# Patient Record
Sex: Female | Born: 1950 | Race: Black or African American | Hispanic: No | Marital: Married | Smoking: Never smoker
Health system: Southern US, Community
[De-identification: ages and names within clinical notes are randomized; demographics above are authoritative.]

## PROBLEM LIST (undated history)

## (undated) DIAGNOSIS — I10 Essential (primary) hypertension: Secondary | ICD-10-CM

## (undated) DIAGNOSIS — S83249A Other tear of medial meniscus, current injury, unspecified knee, initial encounter: Secondary | ICD-10-CM

## (undated) HISTORY — PX: BREAST SURGERY: SHX581

## (undated) HISTORY — PX: TONSILLECTOMY: SUR1361

## (undated) HISTORY — PX: CHOLECYSTECTOMY: SHX55

---

## 2006-07-03 ENCOUNTER — Encounter: Admission: RE | Admit: 2006-07-03 | Discharge: 2006-07-03 | Payer: Self-pay | Admitting: Family Medicine

## 2007-03-26 ENCOUNTER — Encounter: Admission: RE | Admit: 2007-03-26 | Discharge: 2007-03-26 | Payer: Self-pay | Admitting: Family Medicine

## 2007-04-03 ENCOUNTER — Encounter: Admission: RE | Admit: 2007-04-03 | Discharge: 2007-04-03 | Payer: Self-pay | Admitting: Family Medicine

## 2008-05-17 ENCOUNTER — Encounter: Admission: RE | Admit: 2008-05-17 | Discharge: 2008-05-17 | Payer: Self-pay

## 2010-07-26 ENCOUNTER — Encounter: Admission: RE | Admit: 2010-07-26 | Discharge: 2010-07-26 | Payer: Self-pay

## 2013-09-21 ENCOUNTER — Other Ambulatory Visit: Payer: Self-pay

## 2013-09-21 DIAGNOSIS — Z1231 Encounter for screening mammogram for malignant neoplasm of breast: Secondary | ICD-10-CM

## 2013-10-12 ENCOUNTER — Ambulatory Visit: Payer: Self-pay | Admitting: Cardiology

## 2013-10-14 ENCOUNTER — Ambulatory Visit
Admission: RE | Admit: 2013-10-14 | Discharge: 2013-10-14 | Disposition: A | Discharge status: Home / Self Care | Payer: PRIVATE HEALTH INSURANCE | Source: Ambulatory Visit

## 2013-10-14 DIAGNOSIS — Z1231 Encounter for screening mammogram for malignant neoplasm of breast: Secondary | ICD-10-CM

## 2015-06-20 ENCOUNTER — Other Ambulatory Visit: Payer: Self-pay

## 2015-06-20 DIAGNOSIS — Z1231 Encounter for screening mammogram for malignant neoplasm of breast: Secondary | ICD-10-CM

## 2015-06-26 ENCOUNTER — Ambulatory Visit
Admission: RE | Admit: 2015-06-26 | Discharge: 2015-06-26 | Disposition: A | Discharge status: Home / Self Care | Payer: PRIVATE HEALTH INSURANCE | Source: Ambulatory Visit

## 2015-06-26 DIAGNOSIS — Z1231 Encounter for screening mammogram for malignant neoplasm of breast: Secondary | ICD-10-CM

## 2017-08-05 ENCOUNTER — Other Ambulatory Visit: Payer: Self-pay | Admitting: Family Medicine

## 2017-08-05 DIAGNOSIS — Z1231 Encounter for screening mammogram for malignant neoplasm of breast: Secondary | ICD-10-CM

## 2017-08-14 ENCOUNTER — Ambulatory Visit
Admission: RE | Admit: 2017-08-14 | Discharge: 2017-08-14 | Disposition: A | Discharge status: Home / Self Care | Payer: Medicare Other | Source: Ambulatory Visit | Attending: Family Medicine | Admitting: Family Medicine

## 2017-08-14 DIAGNOSIS — Z1231 Encounter for screening mammogram for malignant neoplasm of breast: Secondary | ICD-10-CM

## 2020-03-21 ENCOUNTER — Other Ambulatory Visit: Payer: Self-pay

## 2020-03-21 ENCOUNTER — Ambulatory Visit (INDEPENDENT_AMBULATORY_CARE_PROVIDER_SITE_OTHER): Payer: Medicare Other | Admitting: Orthopedic Surgery

## 2020-03-21 ENCOUNTER — Ambulatory Visit (INDEPENDENT_AMBULATORY_CARE_PROVIDER_SITE_OTHER): Payer: Medicare Other

## 2020-03-21 DIAGNOSIS — G8929 Other chronic pain: Secondary | ICD-10-CM

## 2020-03-21 DIAGNOSIS — M25562 Pain in left knee: Secondary | ICD-10-CM

## 2020-03-21 DIAGNOSIS — M1712 Unilateral primary osteoarthritis, left knee: Secondary | ICD-10-CM

## 2020-03-21 DIAGNOSIS — M25462 Effusion, left knee: Secondary | ICD-10-CM

## 2020-03-22 ENCOUNTER — Encounter: Payer: Self-pay | Admitting: Orthopedic Surgery

## 2020-03-22 DIAGNOSIS — M1712 Unilateral primary osteoarthritis, left knee: Secondary | ICD-10-CM | POA: Diagnosis not present

## 2020-03-22 DIAGNOSIS — M25562 Pain in left knee: Secondary | ICD-10-CM

## 2020-03-22 DIAGNOSIS — G8929 Other chronic pain: Secondary | ICD-10-CM | POA: Diagnosis not present

## 2020-03-22 DIAGNOSIS — M25462 Effusion, left knee: Secondary | ICD-10-CM | POA: Diagnosis not present

## 2020-03-22 MED ORDER — LIDOCAINE HCL (PF) 1 % IJ SOLN
5.0000 mL | INTRAMUSCULAR | Status: AC | PRN
  Administered 2020-03-22: 17:00:00 5 mL

## 2020-03-22 MED ORDER — METHYLPREDNISOLONE ACETATE 40 MG/ML IJ SUSP
40.0000 mg | INTRAMUSCULAR | Status: AC | PRN
  Administered 2020-03-22: 40 mg via INTRA_ARTICULAR

## 2020-03-22 NOTE — Progress Notes (Signed)
   Office Visit Note   Patient: Nicole Knapp           Date of Birth: 06-23-1951           MRN: [id number] Visit Date: 03/21/2020              Requested by: Gaspar Skeeters, MD [address] [place],  [place] [address] PCP: Gaspar Skeeters, MD  Chief Complaint  Patient presents with  . Left Knee - Pain      HPI: Patient is a 69 year old woman who is having progressive increasing pain in the left knee she states that it is gotten worse lately but been present for several years she complains of swelling stiffness decreased range of motion.  She has tried Aleve and extra strength Tylenol without relief.  Assessment & Plan: Visit Diagnoses:  1. Chronic pain of left knee   2. Unilateral primary osteoarthritis, left knee     Plan: Patient's knee was aspirated 40 cc of clear synovial fluid and injected with steroid.  Reevaluate in 4 weeks.  Follow-Up Instructions: Return in about 4 weeks (around 04/18/2020).   Ortho Exam  Patient is alert, oriented, no adenopathy, well-dressed, normal affect, normal respiratory effort. Examination patient has an antalgic gait varus alignment to the left knee she does have a tense effusion.  She has crepitation with range of motion pain to palpation of the patellofemoral joint as well as medial and lateral joint line.  Imaging: No results found. No images are attached to the encounter.  Labs: No results found for: HGBA1C, ESRSEDRATE, CRP, LABURIC, REPTSTATUS, GRAMSTAIN, CULT, LABORGA   No results found for: ALBUMIN, PREALBUMIN, LABURIC  No results found for: MG No results found for: VD25OH  No results found for: PREALBUMIN No flowsheet data found.   There is no height or weight on file to calculate BMI.  Orders:  Orders Placed This Encounter  Procedures  . XR Knee 1-2 Views Left   No orders of the defined types were placed in this encounter.    Procedures: Large Joint Inj: L knee on 03/22/2020 4:52 PM Indications: pain  and diagnostic evaluation Details: 22 G 1.5 in needle, superolateral approach  Arthrogram: No  Medications: 5 mL lidocaine (PF) 1 %; 40 mg methylPREDNISolone acetate 40 MG/ML Aspirate: 40 mL clear Outcome: tolerated well, no immediate complications Procedure, treatment alternatives, risks and benefits explained, specific risks discussed. Consent was given by the patient. Immediately prior to procedure a time out was called to verify the correct patient, procedure, equipment, support staff and site/side marked as required. Patient was prepped and draped in the usual sterile fashion.      Clinical Data: No additional findings.  ROS:  All other systems negative, except as noted in the HPI. Review of Systems  Objective: Vital Signs: There were no vitals taken for this visit.  Specialty Comments:  No specialty comments available.  PMFS History: There are no problems to display for this patient.  History reviewed. No pertinent past medical history.  Family History  Problem Relation Age of Onset  . Breast cancer Neg Hx     History reviewed. No pertinent surgical history. Social History   Occupational History  . Not on file  Tobacco Use  . Smoking status: Not on file  Substance and Sexual Activity  . Alcohol use: Not on file  . Drug use: Not on file  . Sexual activity: Not on file

## 2020-04-20 ENCOUNTER — Other Ambulatory Visit: Payer: Self-pay

## 2020-04-20 ENCOUNTER — Ambulatory Visit (INDEPENDENT_AMBULATORY_CARE_PROVIDER_SITE_OTHER): Payer: Medicare Other | Admitting: Orthopedic Surgery

## 2020-04-20 ENCOUNTER — Encounter: Payer: Self-pay | Admitting: Orthopedic Surgery

## 2020-04-20 DIAGNOSIS — M23222 Derangement of posterior horn of medial meniscus due to old tear or injury, left knee: Secondary | ICD-10-CM | POA: Diagnosis not present

## 2020-04-20 DIAGNOSIS — M1712 Unilateral primary osteoarthritis, left knee: Secondary | ICD-10-CM | POA: Diagnosis not present

## 2020-04-20 NOTE — Progress Notes (Signed)
   Office Visit Note   Patient: Nicole Knapp           Date of Birth: [birth year]           MRN: [id number] Visit Date: 04/20/2020              Requested by: Gaspar Skeeters, MD [address] [place],  [place] [address] PCP: Gaspar Skeeters, MD  Chief Complaint  Patient presents with  . Left Knee - Follow-up    S/p aspiration and injection left knee 03/21/20      HPI: Patient is a 69 year old woman who is seen in follow-up for osteoarthritis medial meniscal pathology left knee.  Patient states the injection only provided temporary relief she states she has pain going up and down stairs and has recurrent effusion.  Assessment & Plan: Visit Diagnoses:  1. Unilateral primary osteoarthritis, left knee   2. Derangement of posterior horn of medial meniscus due to old tear or injury, left knee     Plan: Discussed with the patient we could proceed with an MRI scan for consideration for arthroscopic debridement.  Discussed that arthroscopy for the arthritis and meniscal pathology has a 50% success rate.  Also discussed total knee arthroplasty and the need for aggressive physical therapy postoperatively.  We will follow-up after the MRI scan and discuss options at that time.  Follow-Up Instructions: Return in about 1 week (around 04/27/2020).   Ortho Exam  Patient is alert, oriented, no adenopathy, well-dressed, normal affect, normal respiratory effort. Examination patient has a mild effusion there is no redness no septal cellulitis.  She has point tender to palpation of the medial joint line with mechanical crepitation with range of motion.  Imaging: No results found. No images are attached to the encounter.  Labs: No results found for: HGBA1C, ESRSEDRATE, CRP, LABURIC, REPTSTATUS, GRAMSTAIN, CULT, LABORGA   No results found for: ALBUMIN, PREALBUMIN, LABURIC  No results found for: MG No results found for: VD25OH  No results found for: PREALBUMIN No flowsheet data  found.   There is no height or weight on file to calculate BMI.  Orders:  Orders Placed This Encounter  Procedures  . MR Knee Left w/o contrast   No orders of the defined types were placed in this encounter.    Procedures: No procedures performed  Clinical Data: No additional findings.  ROS:  All other systems negative, except as noted in the HPI. Review of Systems  Objective: Vital Signs: There were no vitals taken for this visit.  Specialty Comments:  No specialty comments available.  PMFS History: There are no problems to display for this patient.  History reviewed. No pertinent past medical history.  Family History  Problem Relation Age of Onset  . Breast cancer Neg Hx     History reviewed. No pertinent surgical history. Social History   Occupational History  . Not on file  Tobacco Use  . Smoking status: Not on file  Substance and Sexual Activity  . Alcohol use: Not on file  . Drug use: Not on file  . Sexual activity: Not on file

## 2020-05-18 ENCOUNTER — Ambulatory Visit
Admission: RE | Admit: 2020-05-18 | Discharge: 2020-05-18 | Disposition: A | Discharge status: Home / Self Care | Payer: Medicare Other | Source: Ambulatory Visit | Attending: Orthopedic Surgery | Admitting: Orthopedic Surgery

## 2020-05-18 ENCOUNTER — Other Ambulatory Visit: Payer: Self-pay

## 2020-05-18 DIAGNOSIS — M23222 Derangement of posterior horn of medial meniscus due to old tear or injury, left knee: Secondary | ICD-10-CM

## 2020-05-18 DIAGNOSIS — M1712 Unilateral primary osteoarthritis, left knee: Secondary | ICD-10-CM

## 2020-05-22 ENCOUNTER — Encounter: Payer: Self-pay | Admitting: Orthopedic Surgery

## 2020-05-22 ENCOUNTER — Other Ambulatory Visit: Payer: Self-pay

## 2020-05-22 ENCOUNTER — Ambulatory Visit (INDEPENDENT_AMBULATORY_CARE_PROVIDER_SITE_OTHER): Payer: Medicare Other | Admitting: Orthopedic Surgery

## 2020-05-22 VITALS — Ht 66.0 in | Wt 180.0 lb

## 2020-05-22 DIAGNOSIS — M23222 Derangement of posterior horn of medial meniscus due to old tear or injury, left knee: Secondary | ICD-10-CM

## 2020-05-22 DIAGNOSIS — M1712 Unilateral primary osteoarthritis, left knee: Secondary | ICD-10-CM | POA: Diagnosis not present

## 2020-05-22 NOTE — Progress Notes (Signed)
   Office Visit Note   Patient: Nicole Knapp           Date of Birth: 1951/01/05           MRN: [id number] Visit Date: 05/22/2020              Requested by: Gaspar Skeeters, MD [address] [place],  [place] [address] PCP: Gaspar Skeeters, MD  Chief Complaint  Patient presents with  . Left Knee - Follow-up, Pain    MRI Review Left Knee      HPI: Patient is a 69 year old woman with persistent medial joint line pain left knee patient complains of persistent swelling as well.  She is status post an MRI scan.  Assessment & Plan: Visit Diagnoses:  1. Unilateral primary osteoarthritis, left knee   2. Derangement of posterior horn of medial meniscus due to old tear or injury, left knee     Plan: Discussed that with the meniscal tear we could proceed with arthroscopic debridement of the meniscal tear as well as debridement of the arthritis.  Discussed that we should build to resolve the pain from the meniscus tear but most likely will not resolve the pain from the arthritis discussed that her improvement may be about 50%.  Patient states she understands wished to proceed with outpatient arthroscopic surgery.  Follow-Up Instructions: Return in about 2 weeks (around 06/05/2020).   Ortho Exam  Patient is alert, oriented, no adenopathy, well-dressed, normal affect, normal respiratory effort. Examination patient has no effusion she has full range of motion of her knee collaterals and cruciates are stable she is still tender to palpation of medial joint line.  Review of the MRI scan shows a complex tear of the posterior medial meniscus as well as tricompartmental arthritic changes.  Imaging: No results found. No images are attached to the encounter.  Labs: No results found for: HGBA1C, ESRSEDRATE, CRP, LABURIC, REPTSTATUS, GRAMSTAIN, CULT, LABORGA   No results found for: ALBUMIN, PREALBUMIN, LABURIC  No results found for: MG No results found for: VD25OH  No results found  for: PREALBUMIN No flowsheet data found.   Body mass index is 29.05 kg/m.  Orders:  No orders of the defined types were placed in this encounter.  No orders of the defined types were placed in this encounter.    Procedures: No procedures performed  Clinical Data: No additional findings.  ROS:  All other systems negative, except as noted in the HPI. Review of Systems  Objective: Vital Signs: Ht 5\' 6"  (1.676 m)   Wt 180 lb (81.6 kg)   BMI 29.05 kg/m   Specialty Comments:  No specialty comments available.  PMFS History: There are no problems to display for this patient.  History reviewed. No pertinent past medical history.  Family History  Problem Relation Age of Onset  . Breast cancer Neg Hx     History reviewed. No pertinent surgical history. Social History   Occupational History  . Not on file  Tobacco Use  . Smoking status: Never Smoker  . Smokeless tobacco: Never Used  Substance and Sexual Activity  . Alcohol use: Never  . Drug use: Never  . Sexual activity: Not Currently

## 2020-05-28 ENCOUNTER — Other Ambulatory Visit: Payer: Self-pay | Admitting: Physician Assistant

## 2020-06-06 ENCOUNTER — Other Ambulatory Visit: Payer: Self-pay

## 2020-06-06 ENCOUNTER — Encounter (HOSPITAL_BASED_OUTPATIENT_CLINIC_OR_DEPARTMENT_OTHER): Payer: Self-pay | Admitting: Orthopedic Surgery

## 2020-06-09 ENCOUNTER — Encounter (HOSPITAL_BASED_OUTPATIENT_CLINIC_OR_DEPARTMENT_OTHER)
Admission: RE | Admit: 2020-06-09 | Discharge: 2020-06-09 | Disposition: A | Discharge status: Home / Self Care | Payer: Medicare Other | Source: Ambulatory Visit | Attending: Orthopedic Surgery | Admitting: Orthopedic Surgery

## 2020-06-09 ENCOUNTER — Other Ambulatory Visit (HOSPITAL_COMMUNITY)
Admission: RE | Admit: 2020-06-09 | Discharge: 2020-06-09 | Disposition: A | Discharge status: Home / Self Care | Payer: Medicare Other | Source: Ambulatory Visit | Attending: Orthopedic Surgery | Admitting: Orthopedic Surgery

## 2020-06-09 DIAGNOSIS — Z20822 Contact with and (suspected) exposure to covid-19: Secondary | ICD-10-CM | POA: Insufficient documentation

## 2020-06-09 DIAGNOSIS — S83242A Other tear of medial meniscus, current injury, left knee, initial encounter: Secondary | ICD-10-CM | POA: Diagnosis present

## 2020-06-09 DIAGNOSIS — M1712 Unilateral primary osteoarthritis, left knee: Secondary | ICD-10-CM | POA: Diagnosis not present

## 2020-06-09 DIAGNOSIS — Z01812 Encounter for preprocedural laboratory examination: Secondary | ICD-10-CM | POA: Diagnosis present

## 2020-06-09 DIAGNOSIS — Z79899 Other long term (current) drug therapy: Secondary | ICD-10-CM | POA: Diagnosis not present

## 2020-06-09 DIAGNOSIS — X58XXXA Exposure to other specified factors, initial encounter: Secondary | ICD-10-CM | POA: Diagnosis not present

## 2020-06-09 DIAGNOSIS — I10 Essential (primary) hypertension: Secondary | ICD-10-CM | POA: Diagnosis not present

## 2020-06-09 LAB — BASIC METABOLIC PANEL
Anion gap: 10 (ref 5–15)
BUN: 8 mg/dL (ref 8–23)
CO2: 26 mmol/L (ref 22–32)
Calcium: 8.9 mg/dL (ref 8.9–10.3)
Chloride: 103 mmol/L (ref 98–111)
Creatinine, Ser: 0.55 mg/dL (ref 0.44–1.00)
GFR calc Af Amer: >60 mL/min (ref >60)
GFR calc non Af Amer: >60 mL/min (ref >60)
Glucose, Bld: 107 mg/dL — ABNORMAL HIGH (ref 70–99)
Potassium: 3.9 mmol/L (ref 3.5–5.1)
Sodium: 139 mmol/L (ref 135–145)

## 2020-06-09 LAB — SARS CORONAVIRUS 2 (TAT 6-24 HRS): SARS Coronavirus 2: NEGATIVE

## 2020-06-09 NOTE — Progress Notes (Signed)

## 2020-06-13 ENCOUNTER — Ambulatory Visit (HOSPITAL_BASED_OUTPATIENT_CLINIC_OR_DEPARTMENT_OTHER)
Admission: RE | Admit: 2020-06-13 | Discharge: 2020-06-13 | Disposition: A | Discharge status: Home / Self Care | Payer: Medicare Other | Attending: Orthopedic Surgery | Admitting: Orthopedic Surgery

## 2020-06-13 ENCOUNTER — Encounter (HOSPITAL_BASED_OUTPATIENT_CLINIC_OR_DEPARTMENT_OTHER): Payer: Self-pay | Admitting: Orthopedic Surgery

## 2020-06-13 ENCOUNTER — Ambulatory Visit (HOSPITAL_BASED_OUTPATIENT_CLINIC_OR_DEPARTMENT_OTHER): Payer: Medicare Other | Admitting: Anesthesiology

## 2020-06-13 ENCOUNTER — Encounter (HOSPITAL_BASED_OUTPATIENT_CLINIC_OR_DEPARTMENT_OTHER)
Admission: RE | Disposition: A | Discharge status: Home / Self Care | Payer: Self-pay | Source: Home / Self Care | Attending: Orthopedic Surgery

## 2020-06-13 ENCOUNTER — Other Ambulatory Visit: Payer: Self-pay

## 2020-06-13 DIAGNOSIS — M1712 Unilateral primary osteoarthritis, left knee: Secondary | ICD-10-CM | POA: Insufficient documentation

## 2020-06-13 DIAGNOSIS — Z79899 Other long term (current) drug therapy: Secondary | ICD-10-CM | POA: Diagnosis not present

## 2020-06-13 DIAGNOSIS — S83242A Other tear of medial meniscus, current injury, left knee, initial encounter: Secondary | ICD-10-CM | POA: Diagnosis not present

## 2020-06-13 DIAGNOSIS — I10 Essential (primary) hypertension: Secondary | ICD-10-CM | POA: Insufficient documentation

## 2020-06-13 DIAGNOSIS — X58XXXA Exposure to other specified factors, initial encounter: Secondary | ICD-10-CM | POA: Insufficient documentation

## 2020-06-13 DIAGNOSIS — M23322 Other meniscus derangements, posterior horn of medial meniscus, left knee: Secondary | ICD-10-CM | POA: Diagnosis not present

## 2020-06-13 HISTORY — DX: Essential (primary) hypertension: I10

## 2020-06-13 HISTORY — PX: KNEE ARTHROSCOPY WITH MEDIAL MENISECTOMY: SHX5651

## 2020-06-13 HISTORY — DX: Other tear of medial meniscus, current injury, unspecified knee, initial encounter: S83.249A

## 2020-06-13 SURGERY — ARTHROSCOPY, KNEE, WITH MEDIAL MENISCECTOMY
Anesthesia: General | Site: Knee | Laterality: Left

## 2020-06-13 MED ORDER — OXYCODONE-ACETAMINOPHEN 5-325 MG PO TABS
1.0000 | ORAL_TABLET | ORAL | 0 refills | Status: AC | PRN
Start: 2020-06-13 — End: 2021-06-13

## 2020-06-13 MED ORDER — FENTANYL CITRATE (PF) 100 MCG/2ML IJ SOLN
INTRAMUSCULAR | Status: DC | PRN
  Administered 2020-06-13 (×5): 25 ug via INTRAVENOUS

## 2020-06-13 MED ORDER — MIDAZOLAM HCL 2 MG/2ML IJ SOLN
INTRAMUSCULAR | Status: AC
  Filled 2020-06-13: qty 2

## 2020-06-13 MED ORDER — ONDANSETRON HCL 4 MG/2ML IJ SOLN
INTRAMUSCULAR | Status: DC | PRN
  Administered 2020-06-13: 4 mg via INTRAVENOUS

## 2020-06-13 MED ORDER — FENTANYL CITRATE (PF) 100 MCG/2ML IJ SOLN
INTRAMUSCULAR | Status: AC
  Filled 2020-06-13: qty 2

## 2020-06-13 MED ORDER — OXYCODONE HCL 5 MG PO TABS: 5.0000 mg | ORAL_TABLET | Freq: Once | ORAL | Status: DC | PRN

## 2020-06-13 MED ORDER — CEFAZOLIN SODIUM-DEXTROSE 2-4 GM/100ML-% IV SOLN
INTRAVENOUS | Status: AC
  Filled 2020-06-13: qty 100

## 2020-06-13 MED ORDER — PROPOFOL 10 MG/ML IV BOLUS
INTRAVENOUS | Status: DC | PRN
  Administered 2020-06-13: 200 mg via INTRAVENOUS

## 2020-06-13 MED ORDER — OXYCODONE HCL 5 MG/5ML PO SOLN: 5.0000 mg | Freq: Once | ORAL | Status: DC | PRN

## 2020-06-13 MED ORDER — AMISULPRIDE (ANTIEMETIC) 5 MG/2ML IV SOLN: 10.0000 mg | Freq: Once | INTRAVENOUS | Status: DC | PRN

## 2020-06-13 MED ORDER — CEFAZOLIN SODIUM-DEXTROSE 2-4 GM/100ML-% IV SOLN
2.0000 g | INTRAVENOUS | Status: AC
  Administered 2020-06-13: 2 g via INTRAVENOUS

## 2020-06-13 MED ORDER — PROPOFOL 10 MG/ML IV BOLUS
INTRAVENOUS | Status: AC
  Filled 2020-06-13: qty 20

## 2020-06-13 MED ORDER — HYDROMORPHONE HCL 1 MG/ML IJ SOLN: 0.2500 mg | INTRAMUSCULAR | Status: DC | PRN

## 2020-06-13 MED ORDER — PROMETHAZINE HCL 25 MG/ML IJ SOLN: 6.2500 mg | INTRAMUSCULAR | Status: DC | PRN

## 2020-06-13 MED ORDER — LIDOCAINE HCL (CARDIAC) PF 100 MG/5ML IV SOSY
PREFILLED_SYRINGE | INTRAVENOUS | Status: DC | PRN
  Administered 2020-06-13: 60 mg via INTRAVENOUS

## 2020-06-13 MED ORDER — SODIUM CHLORIDE 0.9 % IR SOLN
Status: DC | PRN
  Administered 2020-06-13: 6000 mL

## 2020-06-13 MED ORDER — LACTATED RINGERS IV SOLN: INTRAVENOUS | Status: DC

## 2020-06-13 MED ORDER — DEXAMETHASONE SODIUM PHOSPHATE 10 MG/ML IJ SOLN
INTRAMUSCULAR | Status: DC | PRN
  Administered 2020-06-13: 4 mg via INTRAVENOUS

## 2020-06-13 SURGICAL SUPPLY — 36 items
BLADE EXCALIBUR 4.0MM X 13CM (MISCELLANEOUS) ×1
BLADE EXCALIBUR 4.0X13 (MISCELLANEOUS) ×1 IMPLANT
BNDG COHESIVE 6X5 TAN STRL LF (GAUZE/BANDAGES/DRESSINGS) ×5 IMPLANT
COVER WAND RF STERILE (DRAPES) IMPLANT
DISSECTOR 4.0MM X 13CM (MISCELLANEOUS) IMPLANT
DRAPE ARTHROSCOPY W/POUCH 90 (DRAPES) ×3 IMPLANT
DRAPE U-SHAPE 47X51 STRL (DRAPES) ×3 IMPLANT
DRSG EMULSION OIL 3X3 NADH (GAUZE/BANDAGES/DRESSINGS) ×3 IMPLANT
DURAPREP 26ML APPLICATOR (WOUND CARE) ×3 IMPLANT
EXCALIBUR 3.8MM X 13CM (MISCELLANEOUS) ×1 IMPLANT
GAUZE SPONGE 4X4 12PLY STRL (GAUZE/BANDAGES/DRESSINGS) ×3 IMPLANT
GLOVE BIO SURGEON STRL SZ 6.5 (GLOVE) ×2 IMPLANT
GLOVE BIO SURGEONS STRL SZ 6.5 (GLOVE) ×2
GLOVE BIOGEL PI IND STRL 7.0 (GLOVE) IMPLANT
GLOVE BIOGEL PI IND STRL 9 (GLOVE) ×1 IMPLANT
GLOVE BIOGEL PI INDICATOR 7.0 (GLOVE) ×2
GLOVE BIOGEL PI INDICATOR 9 (GLOVE) ×2
GLOVE SURG ORTHO 9.0 STRL STRW (GLOVE) ×3 IMPLANT
GOWN STRL REUS W/ TWL LRG LVL3 (GOWN DISPOSABLE) ×1 IMPLANT
GOWN STRL REUS W/ TWL XL LVL3 (GOWN DISPOSABLE) ×1 IMPLANT
GOWN STRL REUS W/TWL LRG LVL3 (GOWN DISPOSABLE) ×6
GOWN STRL REUS W/TWL XL LVL3 (GOWN DISPOSABLE) ×3
KNEE WRAP E Z 3 GEL PACK (MISCELLANEOUS) ×2 IMPLANT
MANIFOLD NEPTUNE II (INSTRUMENTS) ×2 IMPLANT
NDL SAFETY ECLIPSE 18X1.5 (NEEDLE) ×1 IMPLANT
NDL SPNL 18GX3.5 QUINCKE PK (NEEDLE) IMPLANT
NEEDLE HYPO 18GX1.5 SHARP (NEEDLE) ×3
NEEDLE SPNL 18GX3.5 QUINCKE PK (NEEDLE) ×3 IMPLANT
PACK DSU ARTHROSCOPY (CUSTOM PROCEDURE TRAY) ×3 IMPLANT
PORT APPOLLO RF 90DEGREE MULTI (SURGICAL WAND) IMPLANT
PROBE APOLLO 90XL (SURGICAL WAND) ×2 IMPLANT
SET BASIN DAY SURGERY F.S. (CUSTOM PROCEDURE TRAY) ×3 IMPLANT
SUT ETHILON 2 0 FSLX (SUTURE) ×3 IMPLANT
SYR 20ML LL LF (SYRINGE) ×2 IMPLANT
TOWEL GREEN STERILE FF (TOWEL DISPOSABLE) ×3 IMPLANT
TUBING ARTHROSCOPY IRRIG 16FT (MISCELLANEOUS) ×3 IMPLANT

## 2020-06-13 NOTE — Discharge Instructions (Signed)

## 2020-06-13 NOTE — Anesthesia Procedure Notes (Signed)
Procedure Name: LMA Insertion Date/Time: 06/13/2020 9:50 AM Performed by: Lauralyn Primes, CRNA Pre-anesthesia Checklist: Patient identified, Emergency Drugs available, Suction available and Patient being monitored Patient Re-evaluated:Patient Re-evaluated prior to induction Oxygen Delivery Method: Circle system utilized Preoxygenation: Pre-oxygenation with 100% oxygen Induction Type: IV induction Ventilation: Mask ventilation without difficulty LMA: LMA inserted LMA Size: 4.0 Number of attempts: 1 Airway Equipment and Method: Bite block Placement Confirmation: positive ETCO2 Tube secured with: Tape Dental Injury: Teeth and Oropharynx as per pre-operative assessment

## 2020-06-13 NOTE — Transfer of Care (Signed)
Immediate Anesthesia Transfer of Care Note  Patient: Nicole Knapp  Procedure(s) Performed: LEFT KNEE ARTHROSCOPIC DEBRIDEMENT WITH MEDIAL MENISECTOMY AND CHONDROPLASTY (Left Knee)  Patient Location: PACU  Anesthesia Type:General  Level of Consciousness: awake, alert  and oriented  Airway & Oxygen Therapy: Patient Spontanous Breathing and Patient connected to nasal cannula oxygen  Post-op Assessment: Report given to RN and Post -op Vital signs reviewed and stable  Post vital signs: Reviewed and stable  Last Vitals:  Vitals Value Taken Time  BP 140/101 06/13/20 1036  Temp    Pulse    Resp 15 06/13/20 1038  SpO2    Vitals shown include unvalidated device data.  Last Pain:  Vitals:   06/13/20 0813  TempSrc: Oral  PainSc: 4       Patients Stated Pain Goal: 4 (06/13/20 0813)  Complications: No complications documented.

## 2020-06-13 NOTE — H&P (Signed)
Nicole Knapp is an 69 y.o. female.   Chief Complaint: Left Knee Pain HPI: Patient is a 69 year old woman with persistent medial joint line pain left knee patient complains of persistent swelling as well.  She is status post an MRI scan.  Past Medical History:  Diagnosis Date  . Hypertension   . MMT (medial meniscus tear)    left knee    Past Surgical History:  Procedure Laterality Date  . BREAST SURGERY Left    breast biopsy  . CESAREAN SECTION     x2  . CHOLECYSTECTOMY    . TONSILLECTOMY      Family History  Problem Relation Age of Onset  . Breast cancer Neg Hx    Social History:  reports that she has never smoked. She has never used smokeless tobacco. She reports that she does not drink alcohol and does not use drugs.  Allergies: No Known Allergies  No medications prior to admission.    No results found for this or any previous visit (from the past 48 hour(s)). No results found.  Review of Systems  All other systems reviewed and are negative.   Height 5\' 6"  (1.676 m), weight 81.6 kg. Physical Exam  Patient is alert, oriented, no adenopathy, well-dressed, normal affect, normal respiratory effort. Examination patient has no effusion she has full range of motion of her knee collaterals and cruciates are stable she is still tender to palpation of medial joint line.  Review of the MRI scan shows a complex tear of the posterior medial meniscus as well as tricompartmental arthritic changes. Lungs Clear heart RRR  Assessment/Plan  1. Unilateral primary osteoarthritis, left knee   2. Derangement of posterior horn of medial meniscus due to old tear or injury, left knee     Plan: Discussed that with the meniscal tear we could proceed with arthroscopic debridement of the meniscal tear as well as debridement of the arthritis.  Discussed that we should build to resolve the pain from the meniscus tear but most likely will not resolve the pain from the arthritis discussed  that her improvement may be about 50%.  Patient states she understands wished to proceed with outpatient arthroscopic surgery.  Nicole Taddei, PA 06/13/2020, 6:38 AM

## 2020-06-13 NOTE — Op Note (Signed)
06/13/2020  10:33 AM  PATIENT:  Nicole Knapp    PRE-OPERATIVE DIAGNOSIS:  Medial Meniscal Tear Left Knee  POST-OPERATIVE DIAGNOSIS:  Same  PROCEDURE:  LEFT KNEE ARTHROSCOPIC DEBRIDEMENT WITH MEDIAL MENISECTOMY AND CHONDROPLASTY  SURGEON:  Nadara Mustard, MD  PHYSICIAN ASSISTANT:None ANESTHESIA:   General  PREOPERATIVE INDICATIONS:  Nicole Knapp is a  69 y.o. female with a diagnosis of Medial Meniscal Tear Left Knee who failed conservative measures and elected for surgical management.    The risks benefits and alternatives were discussed with the patient preoperatively including but not limited to the risks of infection, bleeding, nerve injury, cardiopulmonary complications, the need for revision surgery, among others, and the patient was willing to proceed.  OPERATIVE IMPLANTS: None  @ENCIMAGES @  OPERATIVE FINDINGS: Degenerative posterior horn of the medial meniscus with large osteochondral defect of the medial femoral condyle and medial tibial plateau with eburnated bone on the medial tibial plateau  OPERATIVE PROCEDURE: Patient was brought the operating room underwent a general anesthetic.  After adequate levels anesthesia were obtained patient's left lower extremity was prepped using DuraPrep draped into a sterile field a timeout was called.  The scope was inserted through the anterior lateral portal anterior medial working portal was established.  Visualization showed a significant medicine of Vitas this was resected with a shaver and electrocautery was used for hemostasis.  The ACL was intact.  With valgus stress patient had a large degenerative tear of the posterior horn of the medial meniscus this was debrided back to stable viable margin with the shaver.  There was extensive osteochondral defect of the medial tibial plateau with essentially eburnated bone.  The shaver was used to debride the osteochondral defect back to bleeding viable subchondral bone there was also  degenerative osteochondral changes of the medial femoral condyle and this was also debrided back to stable viable margins.  Examination of the lateral joint line in the figure-of-four position showed an intact joint line intact meniscus.  With the knee extended patient had a plica of this was excised there was extensive synovitis that extended down anteriorly over the lateral femoral condyle and this was debrided with a shaver and the electrical wand was used hemostasis.  Survey of all compartments showed there to be no loose bodies the implants removed the portals were closed using 2-0 nylon a sterile dressing was applied patient was extubated taken the PACU in stable condition   DISCHARGE PLANNING:  Antibiotic duration: Preoperative antibiotics  Weightbearing: Weightbearing as tolerated  Pain medication: Prescription for Percocet  Dressing care/ Wound VAC: Discontinue dressing in 2 days  Ambulatory devices: Crutches  Discharge to: Home  Follow-up: In the office 1 week post operative.

## 2020-06-13 NOTE — Anesthesia Preprocedure Evaluation (Signed)
Anesthesia Evaluation    Airway Mallampati: II  TM Distance: >3 FB Neck ROM: Full    Dental no notable dental hx.    Pulmonary    Pulmonary exam normal breath sounds clear to auscultation       Cardiovascular hypertension, Pt. on medications Normal cardiovascular exam Rhythm:Regular Rate:Normal     Neuro/Psych    GI/Hepatic   Endo/Other    Renal/GU      Musculoskeletal   Abdominal   Peds  Hematology   Anesthesia Other Findings   Reproductive/Obstetrics                             Anesthesia Physical Anesthesia Plan  ASA: II  Anesthesia Plan: General   Post-op Pain Management:    Induction: Intravenous  PONV Risk Score and Plan: 3 and Ondansetron, Dexamethasone, Midazolam and Treatment may vary due to age or medical condition  Airway Management Planned: LMA  Additional Equipment:   Intra-op Plan:   Post-operative Plan: Extubation in OR  Informed Consent: I have reviewed the patients History and Physical, chart, labs and discussed the procedure including the risks, benefits and alternatives for the proposed anesthesia with the patient or authorized representative who has indicated his/her understanding and acceptance.     Dental advisory given  Plan Discussed with: CRNA  Anesthesia Plan Comments:         Anesthesia Quick Evaluation

## 2020-06-13 NOTE — Anesthesia Postprocedure Evaluation (Signed)
Anesthesia Post Note  Patient: Nicole Knapp  Procedure(s) Performed: LEFT KNEE ARTHROSCOPIC DEBRIDEMENT WITH MEDIAL MENISECTOMY AND CHONDROPLASTY (Left Knee)     Patient location during evaluation: PACU Anesthesia Type: General Level of consciousness: awake and alert Pain management: pain level controlled Vital Signs Assessment: post-procedure vital signs reviewed and stable Respiratory status: spontaneous breathing, nonlabored ventilation and respiratory function stable Cardiovascular status: blood pressure returned to baseline and stable Postop Assessment: no apparent nausea or vomiting Anesthetic complications: no   No complications documented.  Last Vitals:  Vitals:   06/13/20 1100 06/13/20 1134  BP: 137/83 (!) 161/84  Pulse: 71 76  Resp: 16 18  Temp:  36.7 C  SpO2: 100% 95%    Last Pain:  Vitals:   06/13/20 1134  TempSrc: Oral  PainSc: 0-No pain                 Lowella Curb

## 2020-06-14 ENCOUNTER — Encounter (HOSPITAL_BASED_OUTPATIENT_CLINIC_OR_DEPARTMENT_OTHER): Payer: Self-pay | Admitting: Orthopedic Surgery

## 2020-06-26 ENCOUNTER — Ambulatory Visit (INDEPENDENT_AMBULATORY_CARE_PROVIDER_SITE_OTHER): Payer: Medicare Other | Admitting: Orthopedic Surgery

## 2020-06-26 ENCOUNTER — Other Ambulatory Visit: Payer: Self-pay

## 2020-06-26 ENCOUNTER — Encounter: Payer: Self-pay | Admitting: Orthopedic Surgery

## 2020-06-26 VITALS — Ht 66.0 in | Wt 179.2 lb

## 2020-06-26 DIAGNOSIS — M23222 Derangement of posterior horn of medial meniscus due to old tear or injury, left knee: Secondary | ICD-10-CM

## 2020-06-27 ENCOUNTER — Encounter: Payer: Self-pay | Admitting: Orthopedic Surgery

## 2020-06-27 NOTE — Progress Notes (Signed)
   Office Visit Note   Patient: Nicole Knapp           Date of Birth: November 23, 1951           MRN: [id number] Visit Date: 06/26/2020              Requested by: Gaspar Skeeters, MD [address] [place],  [place] [address] PCP: Gaspar Skeeters, MD  Chief Complaint  Patient presents with  . Left Knee - Routine Post Op    06/13/20 Left Knee Artho & Deb w/medial menisectomy & chondroplasty      HPI: Patient presents 2 weeks status post left knee arthroscopy with debridement medial meniscal tear.  Patient states she has some tenderness medially but otherwise feels like she is doing well.  Assessment & Plan: Visit Diagnoses:  1. Derangement of posterior horn of medial meniscus due to old tear or injury, left knee     Plan: Sutures are harvested she will work on quad VMO strengthening increase her activities as tolerated she was given a note that she may return to work on July 26  Follow-Up Instructions: Return in about 3 weeks (around 07/17/2020).   Ortho Exam  Patient is alert, oriented, no adenopathy, well-dressed, normal affect, normal respiratory effort. Examination patient has some mild swelling there is no cellulitis incisions well-healed sutures are harvested.  Patient does have VMO atrophy.  Imaging: No results found. No images are attached to the encounter.  Labs: No results found for: HGBA1C, ESRSEDRATE, CRP, LABURIC, REPTSTATUS, GRAMSTAIN, CULT, LABORGA   No results found for: ALBUMIN, PREALBUMIN, LABURIC  No results found for: MG No results found for: VD25OH  No results found for: PREALBUMIN No flowsheet data found.   Body mass index is 28.93 kg/m.  Orders:  No orders of the defined types were placed in this encounter.  No orders of the defined types were placed in this encounter.    Procedures: No procedures performed  Clinical Data: No additional findings.  ROS:  All other systems negative, except as noted in the HPI. Review of  Systems  Objective: Vital Signs: Ht 5\' 6"  (1.676 m)   Wt 179 lb 3.7 oz (81.3 kg)   BMI 28.93 kg/m   Specialty Comments:  No specialty comments available.  PMFS History: Patient Active Problem List   Diagnosis Date Noted  . Medial meniscus, posterior horn derangement, left    Past Medical History:  Diagnosis Date  . Hypertension   . MMT (medial meniscus tear)    left knee    Family History  Problem Relation Age of Onset  . Breast cancer Neg Hx     Past Surgical History:  Procedure Laterality Date  . BREAST SURGERY Left    breast biopsy  . CESAREAN SECTION     x2  . CHOLECYSTECTOMY    . KNEE ARTHROSCOPY WITH MEDIAL MENISECTOMY Left 06/13/2020   Procedure: LEFT KNEE ARTHROSCOPIC DEBRIDEMENT WITH MEDIAL MENISECTOMY AND CHONDROPLASTY;  Surgeon: 06/15/2020, MD;  Location: [facility];  Service: Orthopedics;  Laterality: Left;  . TONSILLECTOMY     Social History   Occupational History  . Not on file  Tobacco Use  . Smoking status: Never Smoker  . Smokeless tobacco: Never Used  Substance and Sexual Activity  . Alcohol use: Never  . Drug use: Never  . Sexual activity: Not Currently

## 2020-07-03 IMAGING — MR MR KNEE*L* W/O CM
4 of 7 series · 22 of 40 positions shown · non-contrast
Comparison: None.

CLINICAL DATA: Medial left knee pain. Generalized swelling and
weakness.

EXAM:
MRI OF THE LEFT KNEE WITHOUT CONTRAST
TECHNIQUE: Multiplanar, multisequence MR imaging of the knee was performed. No
intravenous contrast was administered.

[Series 3: T2 fat-sat · axial · 4.0mm · 0.50mm/px · z∈[-48,+67]mm · 6 of 24 slices shown]
[im 1/24]
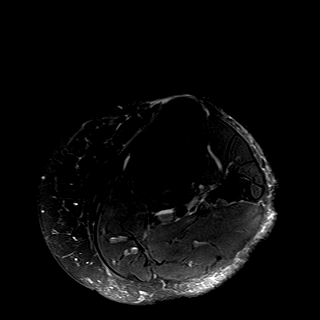
[im 5/24]
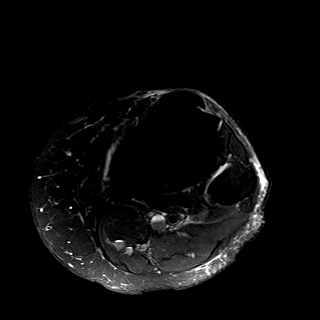
[im 10/24]
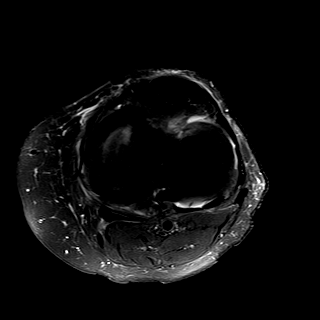
[im 14/24]
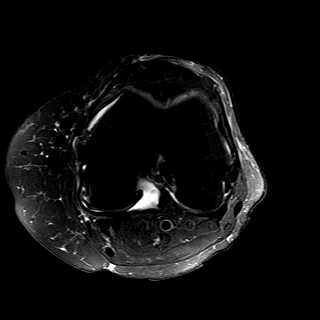
[im 19/24]
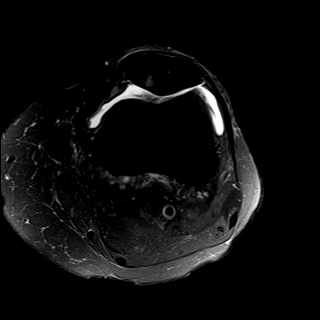
[im 24/24]
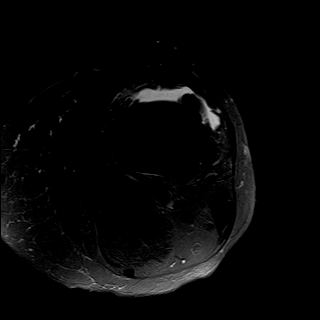

[Series 7: PD fat-sat · sagittal · 3.0mm · 0.29mm/px · 6 of 28 slices shown (1 of 3)]
[im 1/28]
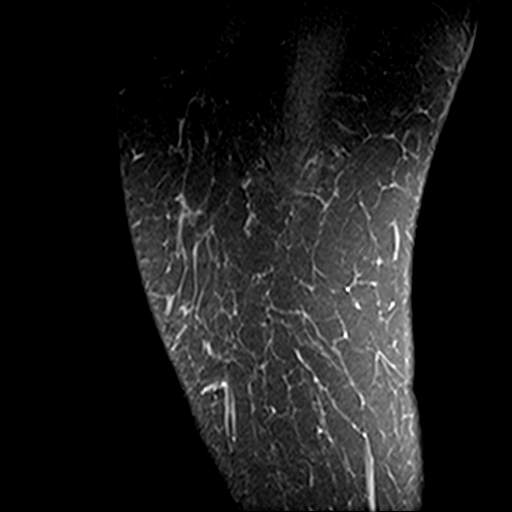
[im 6/28]
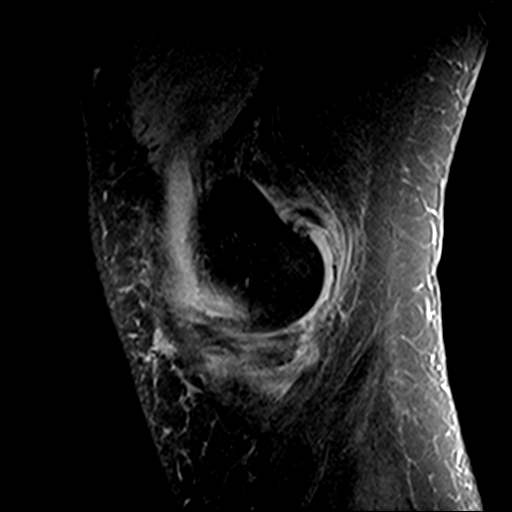
[im 11/28]
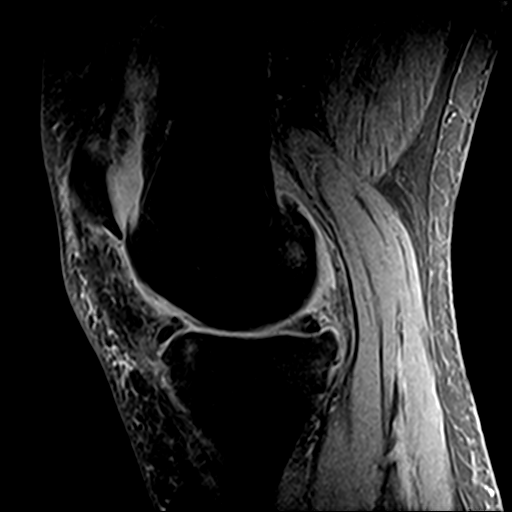
[im 17/28]
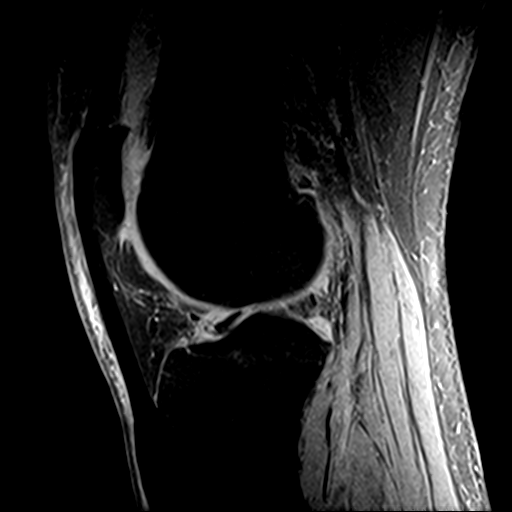
[im 22/28]
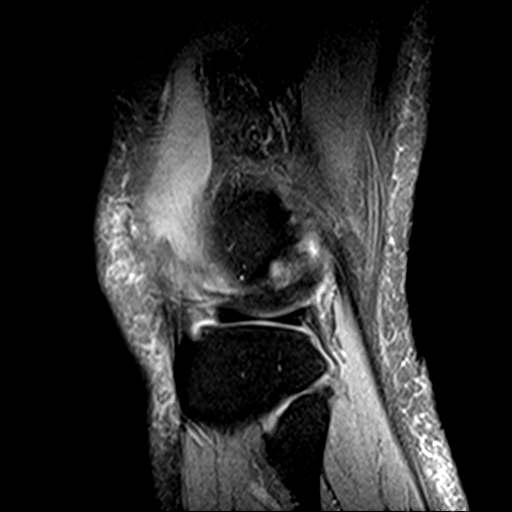
[im 28/28]
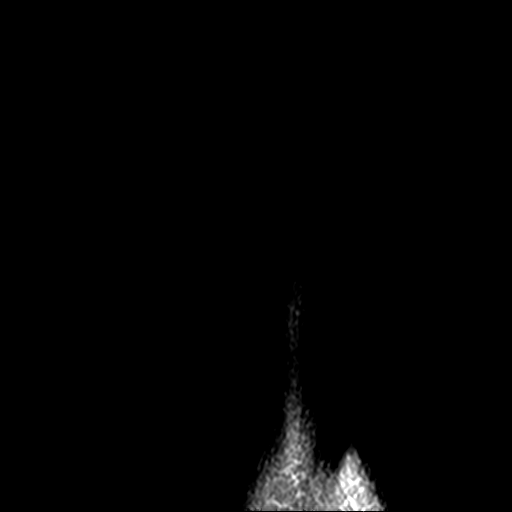

[Series 8: PD fat-sat · coronal · 3.0mm · 0.29mm/px · 7 of 32 slices shown (2 of 3)]
[im 1/32]
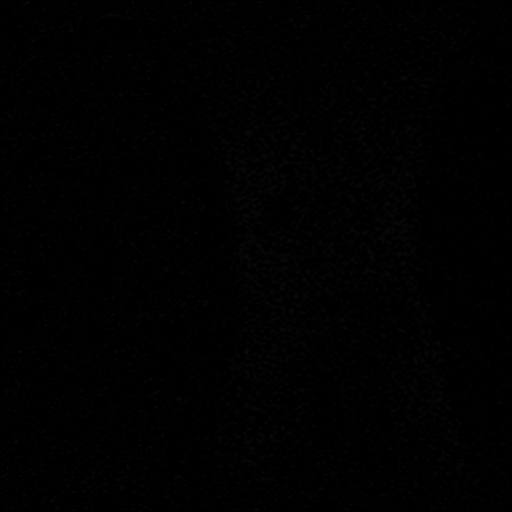
[im 6/32]
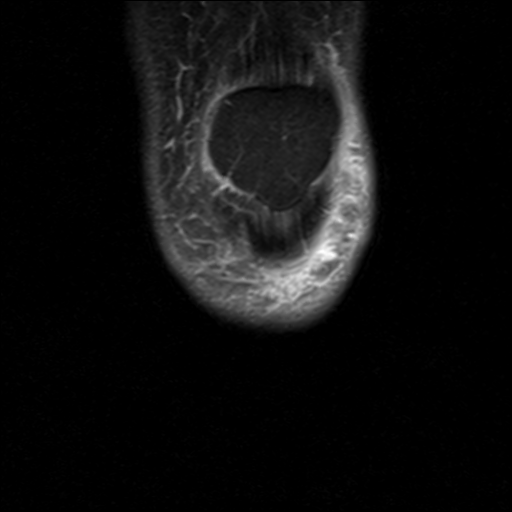
[im 11/32]
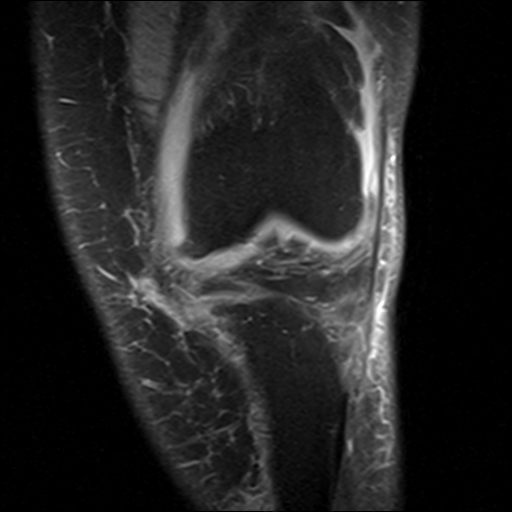
[im 16/32]
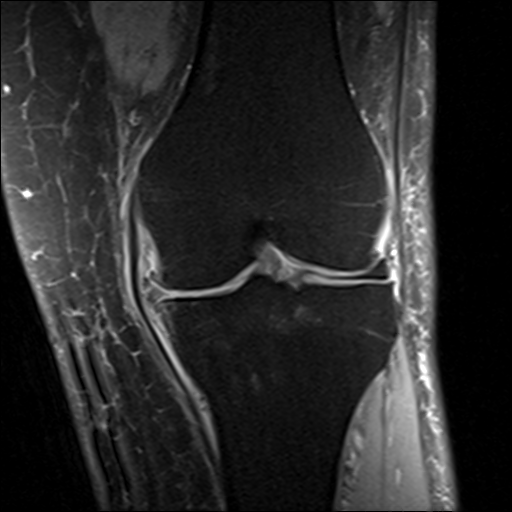
[im 21/32]
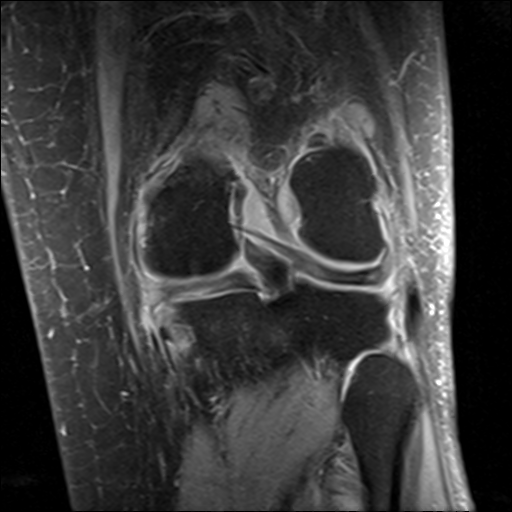
[im 26/32]
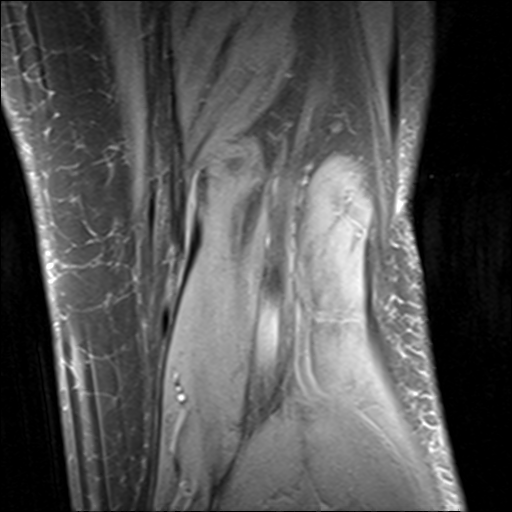
[im 32/32]
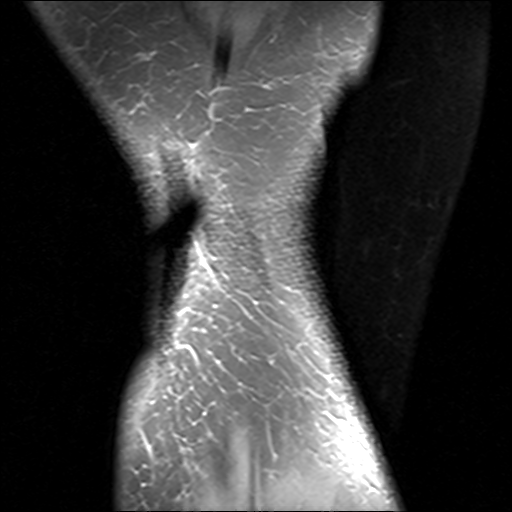

[Series 9: PD fat-sat · oblique · 2.3mm · 0.29mm/px · 3 of 11 slices shown (3 of 3)]
[im 1/11]
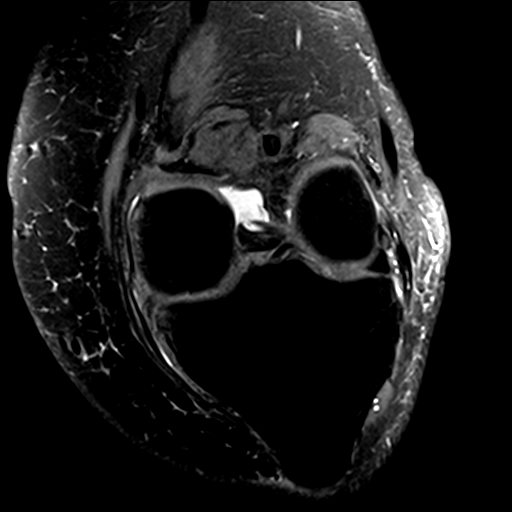
[im 6/11]
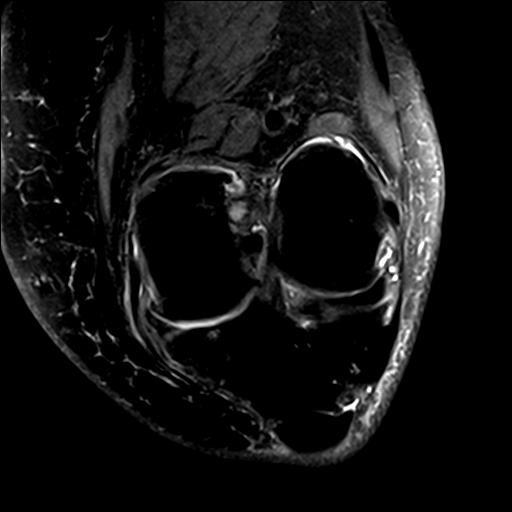
[im 11/11]
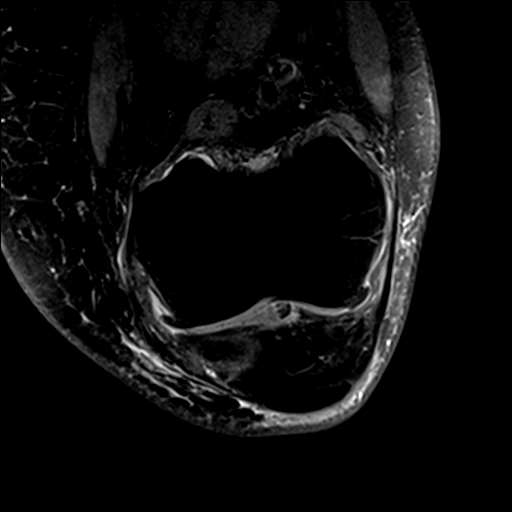

[22 of 40 positions shown; findings below may reference images not displayed]

FINDINGS: MENISCI

Medial meniscus: Complex tear of the posterior horn-body junction of
the medial meniscus. An oblique component of the tear extends into
the posterior horn of the medial meniscus.

Lateral meniscus:  Intact.

LIGAMENTS

Cruciates:  Intact ACL and PCL.

Collaterals: Medial collateral ligament is intact. Lateral
collateral ligament complex is intact.

CARTILAGE

Patellofemoral: Partial-thickness cartilage loss of the medial
femorotibial compartment with subchondral reactive marrow changes in
the medial patellar facet.

Medial: High-grade partial-thickness cartilage loss with areas of
full-thickness cartilage loss of the medial femorotibial compartment
with marginal osteophytes. Subchondral reactive marrow edema at the
periphery of the medial femorotibial compartment.

Lateral: Partial thickness cartilage loss of the lateral
femorotibial compartment.

Joint: Moderate joint effusion. Normal Hoffa's fat. No plical
thickening.

Popliteal Fossa:  No Baker cyst. Intact popliteus tendon.

Extensor Mechanism: Intact quadriceps tendon. Intact patellar
tendon. Intact medial patellar retinaculum. Intact lateral patellar
retinaculum. Intact MPFL.

Bones:  No acute osseous abnormality.  No aggressive osseous lesion.

Other: No fluid collection or hematoma. Muscles are normal.
IMPRESSION: 1. Complex tear of the posterior horn-body junction of the medial
meniscus. An oblique component of the tear extends into the
posterior horn of the medial meniscus.
2. Tricompartmental cartilage abnormalities as described above.
3. Moderate joint effusion.

## 2020-07-17 ENCOUNTER — Ambulatory Visit: Payer: Medicare Other | Admitting: Orthopedic Surgery

## 2020-07-24 ENCOUNTER — Ambulatory Visit: Payer: Medicare Other | Admitting: Orthopedic Surgery

## 2020-07-25 ENCOUNTER — Ambulatory Visit (INDEPENDENT_AMBULATORY_CARE_PROVIDER_SITE_OTHER): Payer: Medicare Other | Admitting: Orthopedic Surgery

## 2020-07-25 ENCOUNTER — Encounter: Payer: Self-pay | Admitting: Orthopedic Surgery

## 2020-07-25 VITALS — Ht 66.0 in | Wt 179.2 lb

## 2020-07-25 DIAGNOSIS — M23222 Derangement of posterior horn of medial meniscus due to old tear or injury, left knee: Secondary | ICD-10-CM

## 2020-07-25 NOTE — Progress Notes (Signed)
   Post-Op Visit Note   Patient: Nicole Knapp           Date of Birth: 11-Jul-1951           MRN: [id number] Visit Date: 07/25/2020 PCP: Gaspar Skeeters, MD  Chief Complaint:  Chief Complaint  Patient presents with  . Left Knee - Routine Post Op    06/13/20 Left Knee Artho & Deb w/medial menisectomy & chondroplasty      HPI:  HPI The patient is a 69 year old woman seen today status post left knee arthroscopy with debridement on June 29.  She states she is pleased that she is starting to finally see some improvement.  She does complain of continued issues with swelling however she relates that the swelling is much better than it had been.  Wonders if there is enough fluid to drain.  She has been working on home exercises and is pleased with her progress  Ortho Exam Portals are well-healed there is very mild swelling of the knee no erythema no warmth no effusion  Visit Diagnoses:  1. 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  Advance her activities as tolerated.  Follow-up in the office in 4 weeks if she fails to improve as expected.  Follow-Up Instructions: Return in about 4 weeks (around 08/22/2020), or if symptoms worsen or fail to improve.   Imaging: No results found.  Orders:  No orders of the defined types were placed in this encounter.  No orders of the defined types were placed in this encounter.    PMFS History: Patient Active Problem List   Diagnosis Date Noted  . Medial meniscus, posterior horn derangement, left    Past Medical History:  Diagnosis Date  . Hypertension   . MMT (medial meniscus tear)    left knee    Family History  Problem Relation Age of Onset  . Breast cancer Neg Hx     Past Surgical History:  Procedure Laterality Date  . BREAST SURGERY Left    breast biopsy  . CESAREAN SECTION     x2  . CHOLECYSTECTOMY    . KNEE ARTHROSCOPY  WITH MEDIAL MENISECTOMY Left 06/13/2020   Procedure: LEFT KNEE ARTHROSCOPIC DEBRIDEMENT WITH MEDIAL MENISECTOMY AND CHONDROPLASTY;  Surgeon: Nadara Mustard, MD;  Location: [facility];  Service: Orthopedics;  Laterality: Left;  . TONSILLECTOMY     Social History   Occupational History  . Not on file  Tobacco Use  . Smoking status: Never Smoker  . Smokeless tobacco: Never Used  Substance and Sexual Activity  . Alcohol use: Never  . Drug use: Never  . Sexual activity: Not Currently

## 2021-02-05 ENCOUNTER — Encounter: Payer: Self-pay | Admitting: Orthopedic Surgery

## 2021-02-05 ENCOUNTER — Ambulatory Visit (INDEPENDENT_AMBULATORY_CARE_PROVIDER_SITE_OTHER): Payer: Medicare Other | Admitting: Physician Assistant

## 2021-02-05 ENCOUNTER — Ambulatory Visit (INDEPENDENT_AMBULATORY_CARE_PROVIDER_SITE_OTHER): Payer: Medicare Other

## 2021-02-05 DIAGNOSIS — G8929 Other chronic pain: Secondary | ICD-10-CM

## 2021-02-05 DIAGNOSIS — M25562 Pain in left knee: Secondary | ICD-10-CM

## 2021-02-05 MED ORDER — METHYLPREDNISOLONE ACETATE 40 MG/ML IJ SUSP
40.0000 mg | INTRAMUSCULAR | Status: AC | PRN
  Administered 2021-02-05: 40 mg via INTRA_ARTICULAR

## 2021-02-05 MED ORDER — LIDOCAINE HCL 1 % IJ SOLN
5.0000 mL | INTRAMUSCULAR | Status: AC | PRN
  Administered 2021-02-05: 5 mL

## 2021-02-05 NOTE — Progress Notes (Signed)
Office Visit Note   Patient: Nicole Knapp           Date of Birth: Jun 17, 1951           MRN: [id number] Visit Date: 02/05/2021              Requested by: Gaspar Skeeters, MD [id number] [place],  [place] [address] PCP: Gaspar Skeeters, MD  Chief Complaint  Patient presents with  . Left Knee - Pain    Hx knee scope 05/2020      HPI: Patient is a pleasant 70 year old active healthy woman who is 8 months status post left knee scope and debridement.  While she initially did better she now has had return of pain to the medial side of her knee.  She is wondering about further options and wondering if she can get an injection today.  She has tried topicals and anti-inflammatories but is worried about taking too much anti-inflammatory.  Assessment & Plan: Visit Diagnoses:  1. Chronic pain of left knee     Plan: Left knee was injected today without difficulty.  She will follow-up in 3 weeks.  I did tell her and reviewed the arthroscopic findings which found significant degenerative changes on the medial femoral condyle and medial tibia.  If she does not have any long lasting improvements with this injection I would recommend discussion about knee replacement surgery.  Follow-Up Instructions: No follow-ups on file.   Ortho Exam  Patient is alert, oriented, no adenopathy, well-dressed, normal affect, normal respiratory effort. Left knee well-healed surgical portals no effusion no erythema no cellulitis tender over the medial joint line  Imaging: No results found. No images are attached to the encounter.  Labs: No results found for: HGBA1C, ESRSEDRATE, CRP, LABURIC, REPTSTATUS, GRAMSTAIN, CULT, LABORGA   No results found for: ALBUMIN, PREALBUMIN, LABURIC  No results found for: MG No results found for: VD25OH  No results found for: PREALBUMIN No flowsheet data found.   There is no height or weight on file to calculate BMI.  Orders:  Orders Placed This Encounter   Procedures  . XR Knee 1-2 Views Left   No orders of the defined types were placed in this encounter.    Procedures: Large Joint Inj on 02/05/2021 10:39 AM Indications: pain and diagnostic evaluation Details: 22 G 1.5 in needle, anteromedial approach  Arthrogram: No  Medications: 40 mg methylPREDNISolone acetate 40 MG/ML; 5 mL lidocaine 1 % Outcome: tolerated well, no immediate complications Procedure, treatment alternatives, risks and benefits explained, specific risks discussed. Consent was given by the patient.      Clinical Data: No additional findings.  ROS:  All other systems negative, except as noted in the HPI. Review of Systems  Objective: Vital Signs: There were no vitals taken for this visit.  Specialty Comments:  No specialty comments available.  PMFS History: Patient Active Problem List   Diagnosis Date Noted  . Medial meniscus, posterior horn derangement, left    Past Medical History:  Diagnosis Date  . Hypertension   . MMT (medial meniscus tear)    left knee    Family History  Problem Relation Age of Onset  . Breast cancer Neg Hx     Past Surgical History:  Procedure Laterality Date  . BREAST SURGERY Left    breast biopsy  . CESAREAN SECTION     x2  . CHOLECYSTECTOMY    . KNEE ARTHROSCOPY WITH MEDIAL MENISECTOMY Left 06/13/2020   Procedure: LEFT KNEE ARTHROSCOPIC DEBRIDEMENT  WITH MEDIAL MENISECTOMY AND CHONDROPLASTY;  Surgeon: Nadara Mustard, MD;  Location: [facility];  Service: Orthopedics;  Laterality: Left;  . TONSILLECTOMY     Social History   Occupational History  . Not on file  Tobacco Use  . Smoking status: Never Smoker  . Smokeless tobacco: Never Used  Substance and Sexual Activity  . Alcohol use: Never  . Drug use: Never  . Sexual activity: Not Currently

## 2021-02-26 ENCOUNTER — Ambulatory Visit: Payer: Medicare Other | Admitting: Orthopedic Surgery

## 2021-06-12 ENCOUNTER — Ambulatory Visit (INDEPENDENT_AMBULATORY_CARE_PROVIDER_SITE_OTHER): Payer: Medicare Other | Admitting: Physician Assistant

## 2021-06-12 ENCOUNTER — Encounter: Payer: Self-pay | Admitting: Orthopedic Surgery

## 2021-06-12 ENCOUNTER — Ambulatory Visit (INDEPENDENT_AMBULATORY_CARE_PROVIDER_SITE_OTHER): Payer: Medicare Other

## 2021-06-12 DIAGNOSIS — M25551 Pain in right hip: Secondary | ICD-10-CM

## 2021-06-12 DIAGNOSIS — M25562 Pain in left knee: Secondary | ICD-10-CM | POA: Diagnosis not present

## 2021-06-12 DIAGNOSIS — G8929 Other chronic pain: Secondary | ICD-10-CM

## 2021-06-12 MED ORDER — LIDOCAINE HCL 1 % IJ SOLN
5.0000 mL | INTRAMUSCULAR | Status: AC | PRN
  Administered 2021-06-12: 5 mL

## 2021-06-12 MED ORDER — MELOXICAM 7.5 MG PO TABS: 7.5000 mg | ORAL_TABLET | Freq: Every day | ORAL | 1 refills | Status: AC

## 2021-06-12 MED ORDER — METHYLPREDNISOLONE ACETATE 40 MG/ML IJ SUSP
40.0000 mg | INTRAMUSCULAR | Status: AC | PRN
  Administered 2021-06-12: 40 mg via INTRA_ARTICULAR

## 2021-06-12 NOTE — Progress Notes (Signed)
Office Visit Note   Patient: Nicole Knapp           Date of Birth: February 27, 1951           MRN: [id number] Visit Date: 06/12/2021              Requested by: Gaspar Skeeters, MD [address] [place],  [place] [address] PCP: Gaspar Skeeters, MD  Chief Complaint  Patient presents with  . Right Hip - Pain  . Left Knee - Pain      HPI: Patient presents today for a injection into her left knee.  She has a history of left knee arthritis symptoms as well in past.  She also has a 2-week history of right groin pain.  She does admit she has a lot of stairs in her house and she has been meeting with her right foot because of the pain in her left knee.  She denies any previous history of back pain or hip pain prior to this denies any trauma denies lifting any heavy objects she feels stiff when she first gets up   Assessment & Plan: Visit Diagnoses:  1. Pain in right hip     Plan: We will call her in a prescription for meloxicam.  Findings most likely consistent with overuse of her hip flexor.  We will also go forward with a left knee injection today patient will follow-up in 3 weeks  Follow-Up Instructions: No follow-ups on file.   Ortho Exam  Patient is alert, oriented, no adenopathy, well-dressed, normal affect, normal respiratory effort. Left knee no effusion no swelling she is tender over the medial lateral joint line and has grinding with range of motion no redness Right hip she points or tenderness in the groin.  She does not have any pain with internal or external rotation.  Is tender over the hip flexor.  Cannot appreciate any masses.  Negative straight leg raise negative radicular symptoms distal strength is 5 out of 5    Imaging: XR HIP UNILAT W OR W/O PELVIS 2-3 VIEWS RIGHT  Result Date: 06/12/2021 AP pelvis and right hip demonstrate well maintained alignment of the hip joint.  There are some mineral degenerative changes.  Of note she does have degenerative changes  in her spine that can be visualized on the pale pelvis film no acute osseous changes  No images are attached to the encounter.  Labs: No results found for: HGBA1C, ESRSEDRATE, CRP, LABURIC, REPTSTATUS, GRAMSTAIN, CULT, LABORGA   No results found for: ALBUMIN, PREALBUMIN, CBC  No results found for: MG No results found for: VD25OH  No results found for: PREALBUMIN No flowsheet data found.   There is no height or weight on file to calculate BMI.  Orders:  Orders Placed This Encounter  Procedures  . XR HIP UNILAT W OR W/O PELVIS 2-3 VIEWS RIGHT   No orders of the defined types were placed in this encounter.    Procedures: Large Joint Inj on 06/12/2021 1:52 PM Indications: pain and diagnostic evaluation Details: 22 G 1.5 in needle  Arthrogram: No  Medications: 40 mg methylPREDNISolone acetate 40 MG/ML; 5 mL lidocaine 1 % Outcome: tolerated well, no immediate complications Procedure, treatment alternatives, risks and benefits explained, specific risks discussed. Consent was given by the patient.     Clinical Data: No additional findings.  ROS:  All other systems negative, except as noted in the HPI. Review of Systems  Objective: Vital Signs: There were no vitals taken for this visit.  Specialty Comments:  No specialty comments available.  PMFS History: Patient Active Problem List   Diagnosis Date Noted  . Medial meniscus, posterior horn derangement, left    Past Medical History:  Diagnosis Date  . Hypertension   . MMT (medial meniscus tear)    left knee    Family History  Problem Relation Age of Onset  . Breast cancer Neg Hx     Past Surgical History:  Procedure Laterality Date  . BREAST SURGERY Left    breast biopsy  . CESAREAN SECTION     x2  . CHOLECYSTECTOMY    . KNEE ARTHROSCOPY WITH MEDIAL MENISECTOMY Left 06/13/2020   Procedure: LEFT KNEE ARTHROSCOPIC DEBRIDEMENT WITH MEDIAL MENISECTOMY AND CHONDROPLASTY;  Surgeon: Nadara Mustard, MD;   Location: [facility];  Service: Orthopedics;  Laterality: Left;  . TONSILLECTOMY     Social History   Occupational History  . Not on file  Tobacco Use  . Smoking status: Never  . Smokeless tobacco: Never  Substance and Sexual Activity  . Alcohol use: Never  . Drug use: Never  . Sexual activity: Not Currently

## 2021-06-12 NOTE — Addendum Note (Signed)
Addended by: Polly Cobia on: 06/12/2021 02:19 PM   Modules accepted: Orders

## 2021-07-03 ENCOUNTER — Encounter: Payer: Self-pay | Admitting: Family

## 2021-07-03 ENCOUNTER — Ambulatory Visit (INDEPENDENT_AMBULATORY_CARE_PROVIDER_SITE_OTHER): Payer: Medicare Other | Admitting: Family

## 2021-07-03 ENCOUNTER — Other Ambulatory Visit: Payer: Self-pay

## 2021-07-03 DIAGNOSIS — M25551 Pain in right hip: Secondary | ICD-10-CM | POA: Diagnosis not present

## 2021-07-03 DIAGNOSIS — M23322 Other meniscus derangements, posterior horn of medial meniscus, left knee: Secondary | ICD-10-CM

## 2021-07-03 MED ORDER — PREDNISONE 50 MG PO TABS: ORAL_TABLET | ORAL | 0 refills | Status: AC

## 2021-07-18 NOTE — Progress Notes (Incomplete)
° °  Office Visit Note   Patient: Nicole Knapp           Date of Birth: [birth year]           MRN: [id number] Visit Date: 07/03/2021              Requested by: Gaspar Skeeters, MD [address] [place],  [place] [address] PCP: Gaspar Skeeters, MD  No chief complaint on file.     HPI: ***  Assessment & Plan: Visit Diagnoses:  1. Pain in right hip   2. Medial meniscus, posterior horn derangement, left     Plan: ***  Follow-Up Instructions: No follow-ups on file.   Ortho Exam  Patient is alert, oriented, no adenopathy, well-dressed, normal affect, normal respiratory effort. ***  Imaging: No results found. No images are attached to the encounter.  Labs: No results found for: HGBA1C, ESRSEDRATE, CRP, LABURIC, REPTSTATUS, GRAMSTAIN, CULT, LABORGA   No results found for: ALBUMIN, PREALBUMIN, CBC  No results found for: MG No results found for: VD25OH  No results found for: PREALBUMIN No flowsheet data found.   There is no height or weight on file to calculate BMI.  Orders:  No orders of the defined types were placed in this encounter.  Meds ordered this encounter  Medications   predniSONE (DELTASONE) 50 MG tablet    Sig: Take one tablet by mouth once daily for 5 days.    Dispense:  5 tablet    Refill:  0     Procedures: No procedures performed  Clinical Data: No additional findings.  ROS:  All other systems negative, except as noted in the HPI. Review of Systems  Objective: Vital Signs: There were no vitals taken for this visit.  Specialty Comments:  No specialty comments available.  PMFS History: Patient Active Problem List   Diagnosis Date Noted   Medial meniscus, posterior horn derangement, left    Past Medical History:  Diagnosis Date   Hypertension    MMT (medial meniscus tear)    left knee    Family History  Problem Relation Age of Onset   Breast cancer Neg Hx     Past Surgical History:  Procedure Laterality  Date   BREAST SURGERY Left    breast biopsy   CESAREAN SECTION     x2   CHOLECYSTECTOMY     KNEE ARTHROSCOPY WITH MEDIAL MENISECTOMY Left 06/13/2020   Procedure: LEFT KNEE ARTHROSCOPIC DEBRIDEMENT WITH MEDIAL MENISECTOMY AND CHONDROPLASTY;  Surgeon: Nadara Mustard, MD;  Location: [facility];  Service: Orthopedics;  Laterality: Left;   TONSILLECTOMY     Social History   Occupational History   Not on file  Tobacco Use   Smoking status: Never   Smokeless tobacco: Never  Substance and Sexual Activity   Alcohol use: Never   Drug use: Never   Sexual activity: Not Currently

## 2021-08-21 ENCOUNTER — Other Ambulatory Visit: Payer: Self-pay | Admitting: Physician Assistant

## 2021-11-28 NOTE — Progress Notes (Signed)
m °

## 2023-10-16 ENCOUNTER — Other Ambulatory Visit: Payer: Self-pay | Admitting: Family Medicine

## 2023-10-16 DIAGNOSIS — Z1231 Encounter for screening mammogram for malignant neoplasm of breast: Secondary | ICD-10-CM

## 2023-10-17 ENCOUNTER — Ambulatory Visit
Admission: RE | Admit: 2023-10-17 | Discharge: 2023-10-17 | Disposition: A | Discharge status: Home / Self Care | Payer: Medicare Other | Source: Ambulatory Visit | Attending: Family Medicine | Admitting: Family Medicine

## 2023-10-17 DIAGNOSIS — Z1231 Encounter for screening mammogram for malignant neoplasm of breast: Secondary | ICD-10-CM

## 2024-10-28 ENCOUNTER — Other Ambulatory Visit: Payer: Self-pay | Admitting: Family Medicine

## 2024-10-28 DIAGNOSIS — Z1231 Encounter for screening mammogram for malignant neoplasm of breast: Secondary | ICD-10-CM

## 2024-11-30 ENCOUNTER — Ambulatory Visit
Admission: RE | Admit: 2024-11-30 | Discharge: 2024-11-30 | Disposition: A | Source: Ambulatory Visit | Attending: Family Medicine | Admitting: Family Medicine

## 2024-11-30 DIAGNOSIS — Z1231 Encounter for screening mammogram for malignant neoplasm of breast: Secondary | ICD-10-CM
